# Patient Record
Sex: Male | Born: 1990 | Smoking: Former smoker
Health system: Southern US, Community
[De-identification: ages and names within clinical notes are randomized; demographics above are authoritative.]

## PROBLEM LIST (undated history)

## (undated) NOTE — ED Provider Notes (Signed)
Formatting of this note is different from the original.  eMERGENCY dEPARTMENT eNCOUnter      CHIEF COMPLAINT    Chief Complaint   Patient presents with   ? Headache-ReEvaluation     Pt states headache unrelenting x 5 days seen at [facility] on 7/4 and given toradol w/ no relief, also states fever of 102 this am (afebrile here), taking OTC excedrin with no relief.     HPI    Cory Hill is a 25 y.o. male who presents to the Emergency Department history of headache for 5 days.  He has never been a headache sufferer he states is continuous Excedrin seems to make it better but when he was at the emergency department on July 4th they gave him Toradol which did nothing for it.  States he has also had some recurrent fever during this time up to 102. No cough no sore throat no abdominal pain.  Reports he vomited once yesterday.  Headache is worsened by shaking his head.  No recent nasal congestion.    PAST MEDICAL HISTORY    History reviewed. No pertinent past medical history.    SURGICAL HISTORY    History reviewed. No pertinent surgical history.    CURRENT MEDICATIONS    No current facility-administered medications for this encounter.      Current Outpatient Medications   Medication Sig Dispense Refill   ? butalbital-acetaminophen-caffeine (FIORICET) 50-325-40 mg per tablet Take 1 tablet by mouth every 6 (six) hours as needed for Headaches. 12 tablet 0     ALLERGIES    No Known Allergies    FAMILY HISTORY    History reviewed. No pertinent family history.    SOCIAL HISTORY    Social History     Socioeconomic History   ? Marital status: Married     Spouse name: None   ? Number of children: None   ? Years of education: None   ? Highest education level: None   Occupational History   ? None   Social Needs   ? Financial resource strain: None   ? Food insecurity     Worry: None     Inability: None   ? Transportation needs     Medical: None     Non-medical: None   Tobacco Use   ? Smoking status: Current Some Day Smoker     Types:  Cigarettes   ? Smokeless tobacco: Never Used   ? Tobacco comment: Hookah occasionally   Substance and Sexual Activity   ? Alcohol use: Yes     Comment: 3x/week   ? Drug use: Never   ? Sexual activity: None   Lifestyle   ? Physical activity     Days per week: None     Minutes per session: None   ? Stress: None   Relationships   ? Social Wellsite geologist on phone: None     Gets together: None     Attends religious service: None     Active member of club or organization: None     Attends meetings of clubs or organizations: None     Relationship status: None   ? Intimate partner violence     Fear of current or ex partner: None     Emotionally abused: None     Physically abused: None     Forced sexual activity: None   Other Topics Concern   ? None   Social History Narrative   ? None  REVIEW OF SYSTEMS    Constitutional:  Positive for fever no chills there is some malaise  Eyes:  No visual complaints  HENT:  Denies sore throat, no ear pain.    Respiratory:  Denies cough, no shortness of breath.    Cardiovascular:  Denies chest pain, no palpitations  GI:  Denies abdominal pain, positive for nausea vomiting  GU: Denies any urinary complaints  Musculoskeletal:  Denies pain or swelling  Back:  Denies pain.    Neck: Denies pain  Skin:  Denies rash.    Neurologic:  Positive for generalized headache    See HPI for further details.  A complete review of systems has been obtained and is included in the HPI as above and as per nursing notes. 10 systems have been reviewed and are otherwise negative.    PHYSICAL EXAM      VITAL SIGNS: BP 131/58 (BP Location: Left upper arm, Patient Position: Sitting)   Pulse 74   Temp 98.3 F (36.8 C) (Oral)   Resp 20   Ht 5\' 7"  (1.702 m)   Wt 135 lb (61.2 kg)   SpO2 100%   BMI 21.14 kg/m   Constitutional:  Does not appear ill or uncomfortable  Head : No appearance of trauma, no raccoon or Battle sign no scalp tenderness  HENT:  Grossly normal with patent airway, no rhinorrhea  Eyes:   PERRL, EOMI, conjunctiva normal, no discharge.  Neck: Supple painless ROM, trachea midline; no nuchal rigidity no meningismus  Respiratory:  breath sounds normal, no resp distress  Cardiovascular:  heart sounds nl, regular rate and rhythm.  Back:  Non tender, normal painless ROM          GI:   Soft, non-tender, no distention, normal active bowel sounds  Musculoskeletal:  atraumatic, no pedal edema, nml ROM, nml color/temp  Skin:  intact, warm dry, no rash  Neurologic:  alert and oriented x 3, CN?s nml as tested, sensation nml, motor nml,    RADIOLOGY    Reviewed by me. See official radiology interpretation.  Cat Scan Head Wo Contrast    Result Date: 10/27/2018  CT OF THE BRAIN WITHOUT CONTRAST: Multiple axial images were obtained from the base of the skull to the vertex without intravenous contrast. CT technique utilized automated dose reduction. COMPARISON: None. FINDINGS: No mass, hemorrhage, edema, or infarction. No hydrocephalus. No abnormal intracranial calcification. No bony or extracranial soft tissue/sinus abnormality.     CONCLUSION: NORMAL CT OF THE BRAIN.  Electronically Signed By: Josefina Do, MD 10/27/2018 11:29    Labs Reviewed   CBC WITH DIFFERENTIAL - Abnormal; Notable for the following components:       Result Value    RBC Count 4.40 (*)     Hemoglobin 13.3 (*)     Hematocrit 38.8 (*)     RDW 11.2 (*)     MPV 9.3 (*)     All other components within normal limits   METABOLIC PANEL, COMPREHENSIVE - Abnormal; Notable for the following components:    Total Bilirubin 3.7 (*)     Alk Phos 40 (*)     All other components within normal limits   URINALYSIS, REFLEX WITH MICROSCOPIC     ED COURSE & MEDICAL DECISION MAKING    Pertinent labs & imaging studies reviewed. (See chart for details)  No remarkable findings on labs nor on imaging in certainly noted diagnostic findings.  I discussed with him they could have a viral meningitis this  would possibly not make him have elevated white count this might also  come with minimal symptoms are than headache and fever after 6 days I have less concern for bacterial meningitis because that would have a much more severe course.  The patient is not open to having a lumbar puncture nor do I feel that he absolutely needs it is at this point I feel that management of his symptoms is appropriate and close follow-up less he worsens in some way.  Patient states he ?feels nothing" at this point and feels much better.    FINAL IMPRESSION    Final diagnoses:   Fever, unspecified fever cause   Acute nonintractable headache, unspecified headache type     Portions of this note may be dictated using Dragon Naturally Speaking voice recognition software. Variances in spelling and vocabulary are possible and unintentional. Not all errors are caught/corrected. Please notify the Thereasa Parkin if any discrepancies are noted or if the meaning of any statement is not clear.       Todd C. Ilsa Iha, MD  10/27/18 1218    Electronically signed by Queen Slough. Ilsa Iha, MD at 10/27/2018 12:18 PM EDT

---
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
## 2012-04-12 IMAGING — CR DG FINGER MIDDLE 2+V*R*
3 series · 3 of 3 positions shown · non-contrast
Comparison: None.

CLINICAL DATA: Smashed tip of right third finger under sewer
drainage pipe lid; swelling and bruising at the distal third
finger.

RIGHT MIDDLE FINGER 2+V

[x finger pa right]
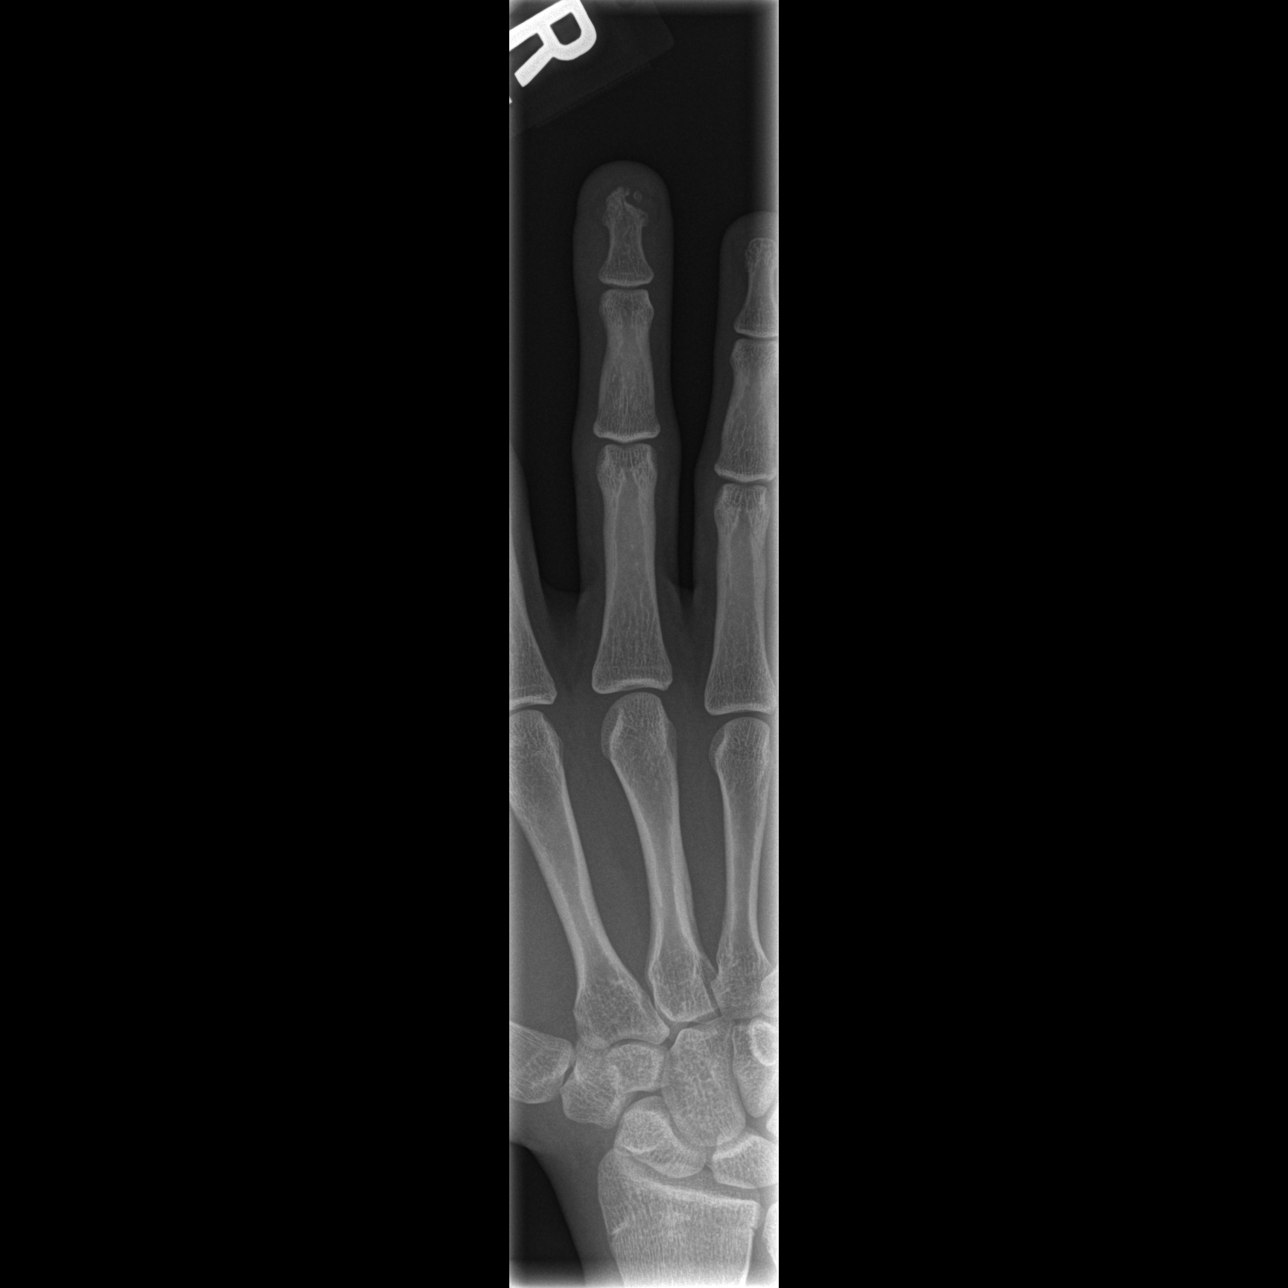

[x finger obl. right]
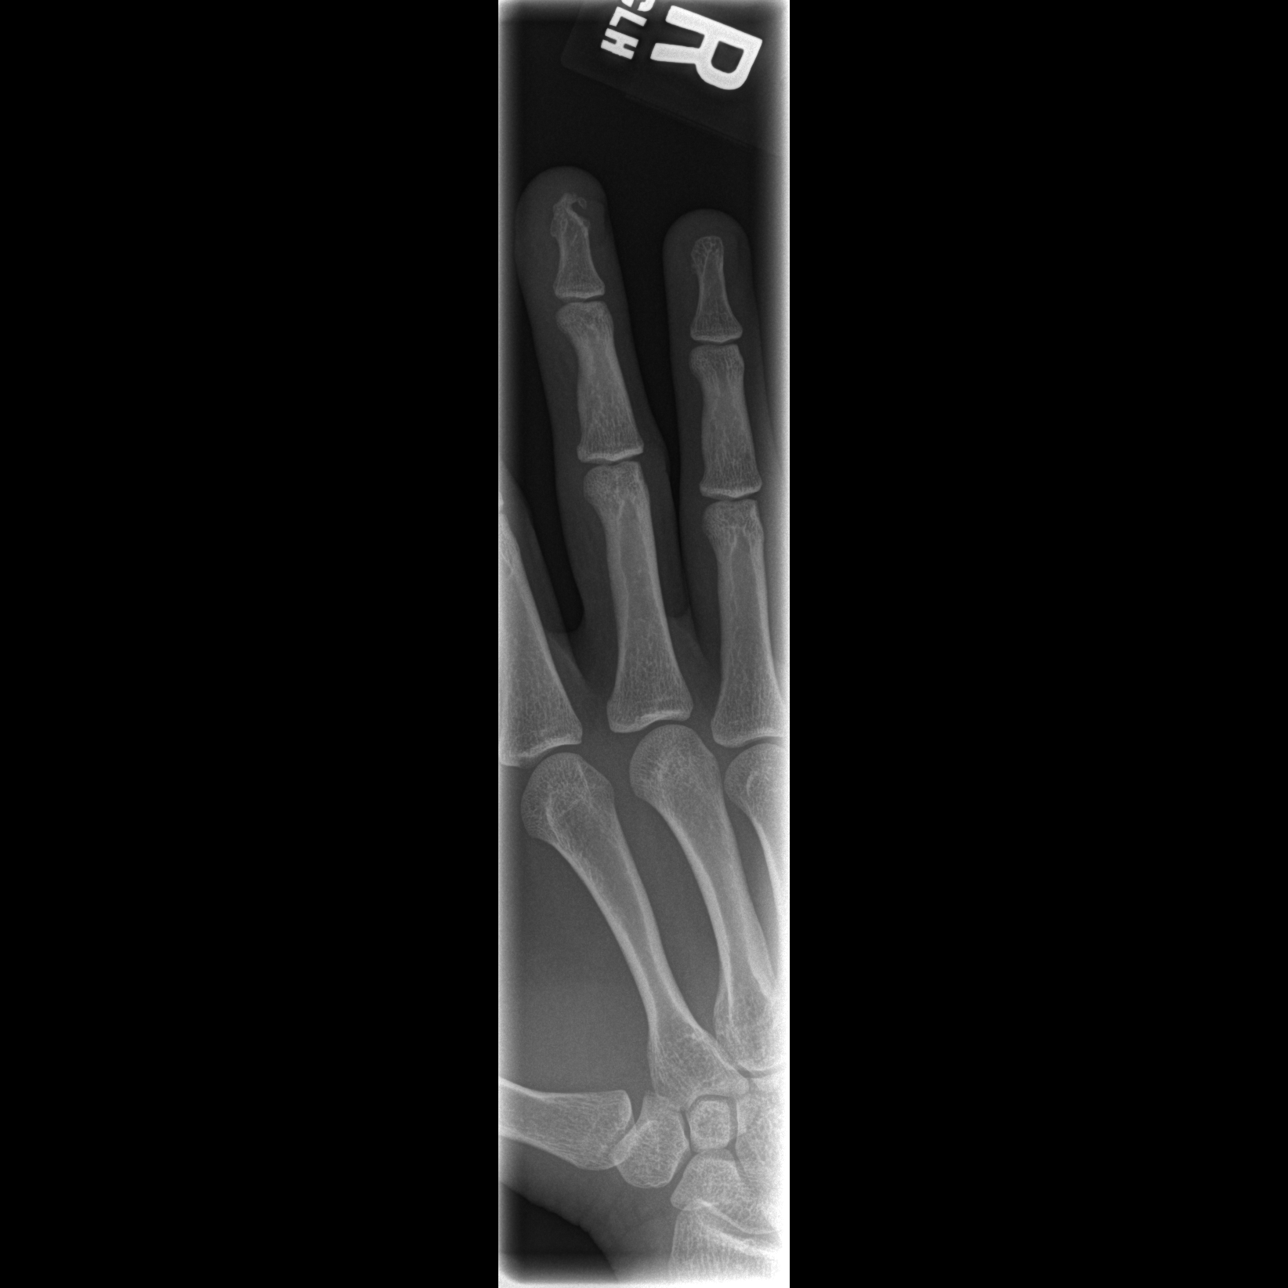

[x finger lateral right]
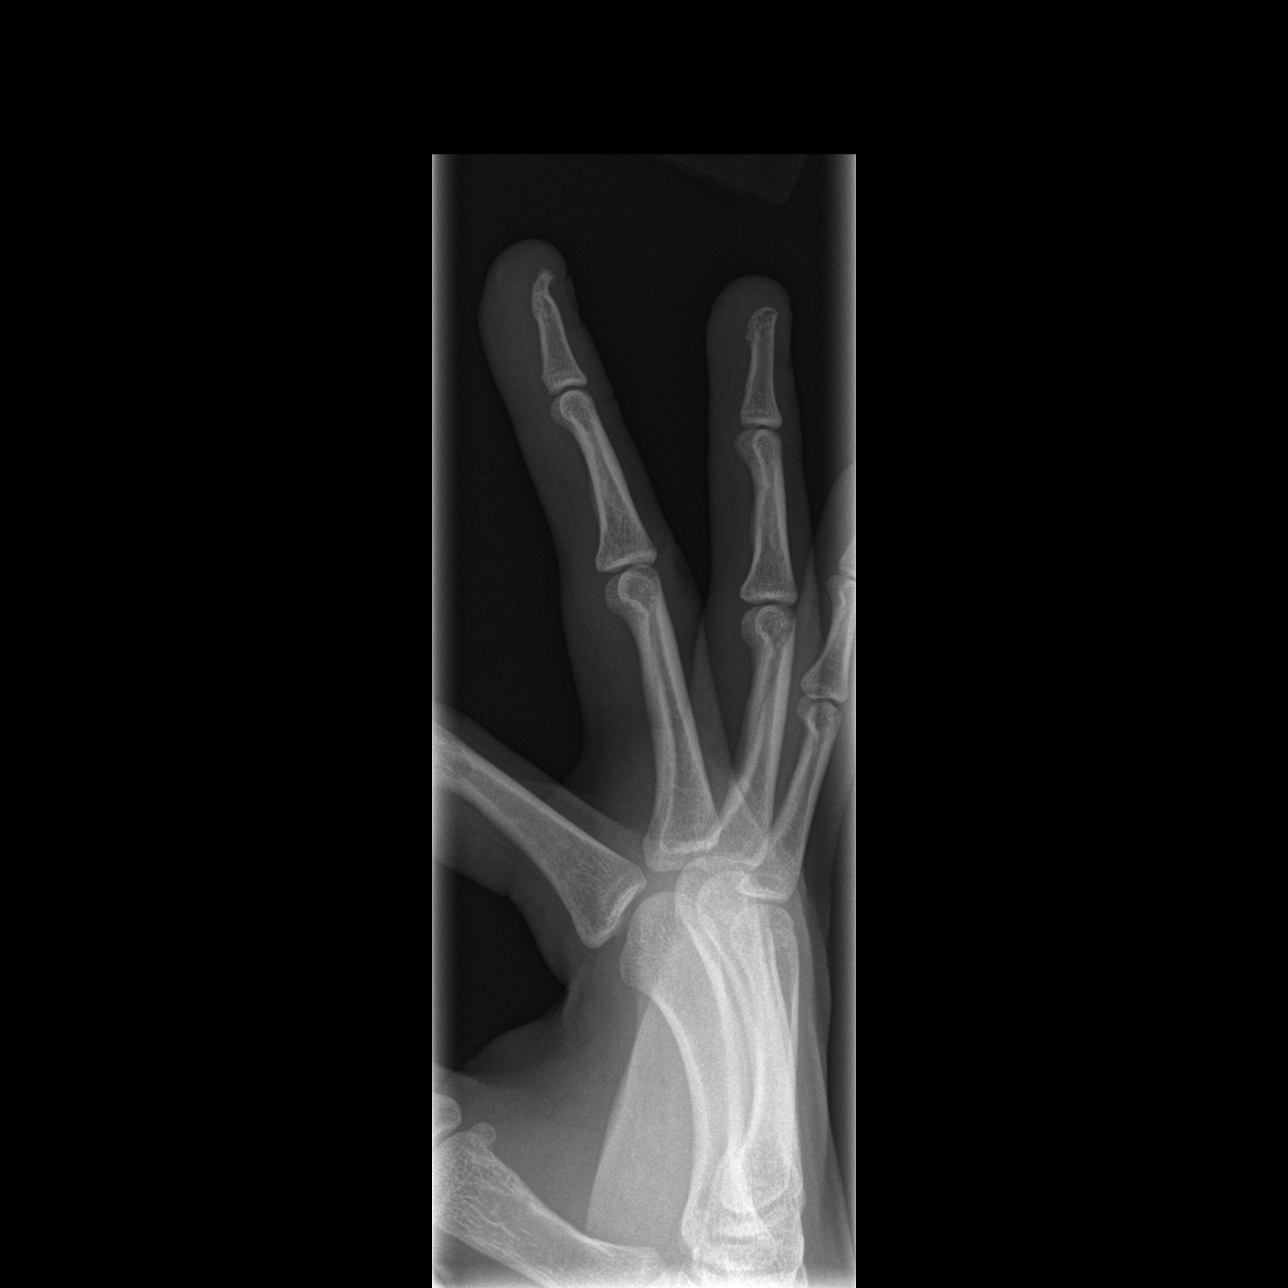

[3 of 3 positions shown; findings below may reference images not displayed]

FINDINGS: Mild focal irregularity at the ulnar aspect of the distal
tuft of the third digit appears to reflect a mild impaction injury
involving the distal tuft, with a small displaced osseous fragment.
There is slight dorsal tilt on the lateral view, due to the mild
impaction.

Visualized joint spaces are preserved.  Mild soft tissue swelling
is noted at the distal aspect of the third digit.
IMPRESSION: Mild focal irregularity at the ulnar aspect of the distal tuft of
the third digit appears to reflect a mild impaction fracture
involving the distal tuft, with a small displaced osseous fragment.

## 2013-03-18 ENCOUNTER — Ambulatory Visit: Payer: Self-pay | Admitting: Family Medicine

## 2013-03-18 VITALS — BP 110/64 | HR 72 | Temp 99.5°F | Resp 18 | Ht 68.5 in | Wt 130.2 lb

## 2013-03-18 DIAGNOSIS — R51 Headache: Secondary | ICD-10-CM

## 2013-03-18 LAB — POCT CBC
Granulocyte percent: 63.1 %G (ref 37–80)
HCT, POC: 42.5 % — AB (ref 43.5–53.7)
Hemoglobin: 13.4 g/dL — AB (ref 14.1–18.1)
Lymph, poc: 2.6 (ref 0.6–3.4)
MCH, POC: 29.2 pg (ref 27–31.2)
MCHC: 31.5 g/dL — AB (ref 31.8–35.4)
MCV: 92.6 fL (ref 80–97)
MID (cbc): 0.6 (ref 0–0.9)
MPV: 7.9 fL (ref 0–99.8)
POC Granulocyte: 5.6 (ref 2–6.9)
POC LYMPH PERCENT: 29.6 %L (ref 10–50)
POC MID %: 7.3 %M (ref 0–12)
Platelet Count, POC: 236 10*3/uL (ref 142–424)
RBC: 4.59 M/uL — AB (ref 4.69–6.13)
RDW, POC: 13.1 %
WBC: 8.8 10*3/uL (ref 4.6–10.2)

## 2013-03-18 MED ORDER — HYDROCODONE-ACETAMINOPHEN 5-325 MG PO TABS: 1.0000 | ORAL_TABLET | Freq: Four times a day (QID) | ORAL | Status: AC | PRN

## 2013-03-18 NOTE — Progress Notes (Signed)
22 yo unemployed individual with several days of global headache associated with vomiting.  Vomiting occurs in am after sleeping all night  Sig Negs: no visual changes, no trauma, possible low grade temp  Associated with body aches.  Family history:  No headaches  Objective:  NAD, alert HEENT: unremarkable Chest:  Clear Heart: reg, no murmur Skin: no rash  Results for orders placed in visit on 03/18/13  POCT CBC      Result Value Range   WBC 8.8  4.6 - 10.2 K/uL   Lymph, poc 2.6  0.6 - 3.4   POC LYMPH PERCENT 29.6  10 - 50 %L   MID (cbc) 0.6  0 - 0.9   POC MID % 7.3  0 - 12 %M   POC Granulocyte 5.6  2 - 6.9   Granulocyte percent 63.1  37 - 80 %G   RBC 4.59 (*) 4.69 - 6.13 M/uL   Hemoglobin 13.4 (*) 14.1 - 18.1 g/dL   HCT, POC 57.8 (*) 46.9 - 53.7 %   MCV 92.6  80 - 97 fL   MCH, POC 29.2  27 - 31.2 pg   MCHC 31.5 (*) 31.8 - 35.4 g/dL   RDW, POC 62.9     Platelet Count, POC 236  142 - 424 K/uL   MPV 7.9  0 - 99.8 fL   Assessment:  Brief onset of headache associated with vomiting and low grade fever suggestive of viral headache.  Plan:  norco 5-325 tid x 48 hours.  If worsening, call and we will proceed with CT scan, no contrast, of head   Signed, Elvina Sidle, MD

## 2013-03-18 NOTE — Patient Instructions (Signed)
Migraine Headache A migraine headache is an intense, throbbing pain on one or both sides of your head. A migraine can last for 30 minutes to several hours. CAUSES  The exact cause of a migraine headache is not always known. However, a migraine may be caused when nerves in the brain become irritated and release chemicals that cause inflammation. This causes pain. SYMPTOMS  Pain on one or both sides of your head.  Pulsating or throbbing pain.  Severe pain that prevents daily activities.  Pain that is aggravated by any physical activity.  Nausea, vomiting, or both.  Dizziness.  Pain with exposure to bright lights, loud noises, or activity.  General sensitivity to bright lights, loud noises, or smells. Before you get a migraine, you may get warning signs that a migraine is coming (aura). An aura may include:  Seeing flashing lights.  Seeing bright spots, halos, or zig-zag lines.  Having tunnel vision or blurred vision.  Having feelings of numbness or tingling.  Having trouble talking.  Having muscle weakness. MIGRAINE TRIGGERS  Alcohol.  Smoking.  Stress.  Menstruation.  Aged cheeses.  Foods or drinks that contain nitrates, glutamate, aspartame, or tyramine.  Lack of sleep.  Chocolate.  Caffeine.  Hunger.  Physical exertion.  Fatigue.  Medicines used to treat chest pain (nitroglycerine), birth control pills, estrogen, and some blood pressure medicines. DIAGNOSIS  A migraine headache is often diagnosed based on:  Symptoms.  Physical examination.  A CT scan or MRI of your head. TREATMENT Medicines may be given for pain and nausea. Medicines can also be given to help prevent recurrent migraines.  HOME CARE INSTRUCTIONS  Only take over-the-counter or prescription medicines for pain or discomfort as directed by your caregiver. The use of long-term narcotics is not recommended.  Lie down in a dark, quiet room when you have a migraine.  Keep a journal  to find out what may trigger your migraine headaches. For example, write down:  What you eat and drink.  How much sleep you get.  Any change to your diet or medicines.  Limit alcohol consumption.  Quit smoking if you smoke.  Get 7 to 9 hours of sleep, or as recommended by your caregiver.  Limit stress.  Keep lights dim if bright lights bother you and make your migraines worse. SEEK IMMEDIATE MEDICAL CARE IF:   Your migraine becomes severe.  You have a fever.  You have a stiff neck.  You have vision loss.  You have muscular weakness or loss of muscle control.  You start losing your balance or have trouble walking.  You feel faint or pass out.  You have severe symptoms that are different from your first symptoms. MAKE SURE YOU:   Understand these instructions.  Will watch your condition.  Will get help right away if you are not doing well or get worse. Document Released: 04/09/2005 Document Revised: 07/02/2011 Document Reviewed: 03/30/2011 [employer] Patient Information 2014 [employer].  

## 2013-12-11 ENCOUNTER — Ambulatory Visit (INDEPENDENT_AMBULATORY_CARE_PROVIDER_SITE_OTHER): Payer: Self-pay | Admitting: Family Medicine

## 2013-12-11 VITALS — BP 119/72 | HR 70 | Temp 98.0°F | Resp 18 | Wt 127.0 lb

## 2013-12-11 DIAGNOSIS — R894 Abnormal immunological findings in specimens from other organs, systems and tissues: Secondary | ICD-10-CM

## 2013-12-11 MED ORDER — ACYCLOVIR 400 MG PO TABS: 400.0000 mg | ORAL_TABLET | Freq: Two times a day (BID) | ORAL | Status: AC

## 2013-12-11 NOTE — Patient Instructions (Signed)
As we discussed a positive blood test may not be definitely a herpes outbreak.  You can take the acyclovir twice per day for prevention of outbreak, but condom use everytime.  If new rash - I recommend you return to clinic right away so we can do an actual  Swab of area for more definitive testing.  If you do have a new rash, or outbreak - the dose of acyclovir is 3 times per day for 5 days.  Return to the clinic or go to the nearest emergency room if any of your symptoms worsen or new symptoms occur.  To look up more info on your condition, go to the website [email or web address], then on patient resources - select UPTODATE. Under patient resources, select genital herpes

## 2013-12-11 NOTE — Progress Notes (Signed)
Subjective:    Patient ID: Daniel Little Alkhatib, male    DOB: 1990/06/21, 21 y.o.   MRN: [id number] This chart was scribed for Meredith StaggersJeffrey Jisele Price MD by Julian HyMorgan Graham, ED Scribe. The patient was seen in Room 4. The patient's care was started at 2:17 PM.   12/11/2013  HPI Daniel Little Saylor is a 23 y.o. 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  Pt reports he has had 4 lifetime partners. Pt reports he still has a blister on his penis. Pt denies penile pain, headache, fatigue, fever, chills, testicular pain, and no dysuria.   Review of Systems  Constitutional: Negative for fever.  Genitourinary: Positive for discharge. Negative for penile swelling, penile pain and testicular pain.    No past medical history on file. No past surgical history on file. No Known Allergies Current Outpatient Prescriptions  Medication Sig Dispense Refill  . HYDROcodone-acetaminophen (NORCO) 5-325 MG per tablet Take 1 tablet by mouth every 6 (six) hours as needed for moderate pain.  12 tablet  0   No current facility-administered medications for this visit.       Objective:  Physical Exam  Nursing note and vitals reviewed. Constitutional: He is oriented to person, place, and time. He appears well-developed and well-nourished. No distress.  But anxious appearing at times discussing results form outside blood test.   HENT:  Head: Normocephalic and atraumatic.  Pulmonary/Chest: Effort normal.  Genitourinary:  Small, healing, reddened area under the mid aspect of the penial shaft. No vesicles or discharge, no other visible rash. No inguinal LAD. No penile discharge. No testicular tenderness.   Neurological:  He is alert and oriented to person, place, and time.  Skin: Skin is warm and dry.  Psychiatric: His speech is normal and behavior is normal. Thought content normal. His mood appears anxious.     Filed Vitals:   12/11/13 1331  BP: 119/72  Pulse: 70  Temp: 98 F (36.7 C)  TempSrc: Oral  Resp: 18  Weight: 127 lb (57.607 kg)  SpO2: 99%  Body mass index is 19.03 kg/(m^2).   Assessment & Plan:  2:30 PM- Patient informed of current plan for treatment and evaluation and agrees with plan at this time.  Daniel Little Lieske is a 23 y.o. 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  Discussed and answered multiple questions at length about interpreting this test.  Resolving papule/pustule on undersurface of penile shaft could have been HSV, but discussed usual 1st episode as more involvement with multiple painful vesicles usually. Ideally if has another new rash - return early in course to have viral swab of affected area for clarification.   He would like to start daily suppressive therapy in case the recent penile rash was genital HSV. Discussed strict condom use, even with taking suppressive/daily med.   Acyclovir 400mg  BID, follow up for refill in next 6 months - sooner if any new rash for confirmation testing as above.    Meds ordered this encounter  Medications  . acyclovir (ZOVIRAX) 400 MG tablet    Sig: Take 1 tablet (400 mg total) by mouth  2 (two) times daily.    Dispense:  60 tablet    Refill:  5   Patient Instructions  As we discussed a positive blood test may not be definitely a herpes outbreak.  You can take the acyclovir twice per day for prevention of outbreak, but condom use everytime.  If new rash - I recommend you return to clinic right away so we can do an actual  Swab of area for more definitive testing.  If you do have a new rash, or outbreak - the dose of acyclovir is 3 times per day for 5 days.  Return to  the clinic or go to the nearest emergency room if any of your symptoms worsen or new symptoms occur.  To look up more info on your condition, go to the website [email or web address], then on patient resources - select UPTODATE. Under patient resources, select genital herpes    I personally performed the services described in this documentation, which was scribed in my presence. The recorded information has been reviewed and considered, and addended by me as needed.

## 2022-06-26 NOTE — Progress Notes (Signed)
[id number]  [place], [place] [address]  Ph. [phone], Joylene Igo [id number]                Date of Exam: 06/27/2022 9:15 AM        Patient ID: Cory Hill is a 32 y.o. male.  Provider: Roxy Manns, FNP        Chief Complaint:    Chief Complaint   Patient presents with    Annual Exam             HPI:    Pt presents today for a wellness exam. Patient is fasting .  6 months ago moved from [place].    Chronic diseases: None  Refills needed today: None    Depression Screening  PHQ9: 5, more like work stress  Concerns for SI or HI: No    Vaccines:  - TDAP: sometime in the last 5 years, declined it today.  - FLU: No  - COVID: Yes, no booster    Regular Screening  Eye exam: no  Dental exam: regularly, every 6 months  Exercise: 1-2 times every week. Always walking, active  Diet: good  Alcohol: 2 drinks a day  Smoking: former occasional smoker, 1 cigarrete every 2-3 months.  Drug: Never  Sexually active: is sexually active and and is monogomous with current partner, 1 male partner  Concerns for STDs:No            Problem List:    There is no problem list on file for this patient.            Current Meds:    No current outpatient medications on file.        Allergies:    No Known Allergies        Past Medical History:      History reviewed. No pertinent past medical history.        Past Surgical History:    History reviewed. No pertinent surgical history.        Family History:    History reviewed. No pertinent family history.        Social History:    Social History     Tobacco Use    Smoking status: Former     Types: Cigarettes    Smokeless tobacco: Never   Vaping Use    Vaping Use: Former   Substance Use Topics    Alcohol use: Yes     Alcohol/week: 14.0 standard drinks of alcohol     Types: 14 Shots of liquor per week    Drug use: Not Currently           Sections Reviewed:       The following sections were reviewed this encounter by the provider:   Tobacco  Allergies  Meds  Problems   Med Hx  Surg Hx  Fam Hx            Vital Signs:    Visit Vitals  BP 100/60 (BP Site: Right arm, Patient Position: Sitting, Cuff Size: Medium)   Pulse 64   Temp 98 F (36.7 C) (Temporal)   Resp 16   Ht 1.708 m (5' 7.25")   Wt 62.6 kg (138 lb)   BMI 21.45 kg/m             ROS:    Review of Systems   Constitutional:  Negative for fatigue and fever.   HENT:  Negative for ear pain, rhinorrhea and sore throat.  Respiratory:  Negative for cough and shortness of breath.    Cardiovascular:  Negative for chest pain.   Gastrointestinal:  Negative for abdominal pain and vomiting.   Genitourinary:  Negative for difficulty urinating.   Musculoskeletal:  Negative for back pain and neck pain.   Skin:  Negative for rash.   Neurological:  Negative for dizziness and weakness.   Hematological:  Does not bruise/bleed easily.   Psychiatric/Behavioral:  Negative for dysphoric mood.               Physical Exam:    Physical Exam  Vitals and nursing note reviewed.   Constitutional:       Appearance: Normal appearance.   HENT:      Head: Normocephalic and atraumatic.      Right Ear: Tympanic membrane, ear canal and external ear normal.      Left Ear: Tympanic membrane, ear canal and external ear normal.      Nose: Nose normal.      Mouth/Throat:      Mouth: Mucous membranes are moist.   Eyes:      Extraocular Movements: Extraocular movements intact.      Pupils: Pupils are equal, round, and reactive to light.   Cardiovascular:      Rate and Rhythm: Normal rate and regular rhythm.      Heart sounds: Normal heart sounds.   Pulmonary:      Effort: Pulmonary effort is normal.      Breath sounds: Normal breath sounds.   Abdominal:      General: Abdomen is flat. Bowel sounds are normal.      Palpations: Abdomen is soft.   Genitourinary:     Comments: Exam deferred today since pt is not having any pain and has not felt any abnormalities  Discussed self examination  Recommend pt schedule an appointment if symptoms occur    Musculoskeletal:          General: Normal range of motion.      Cervical back: Neck supple.   Skin:     General: Skin is warm.      Capillary Refill: Capillary refill takes less than 2 seconds.   Neurological:      General: No focal deficit present.      Mental Status: He is alert and oriented to person, place, and time. Mental status is at baseline.   Psychiatric:         Mood and Affect: Mood normal.         Thought Content: Thought content normal.         Judgment: Judgment normal.                Assessment:    1. Wellness examination  - CBC and differential  - Comprehensive metabolic panel  - Lipid panel  - Hepatitis C (HCV) antibody, Total    2. Screening for diabetes mellitus  - Comprehensive metabolic panel    3. Lipid screening  - Lipid panel    4. Encounter for hepatitis C screening test for low risk patient  - Hepatitis C (HCV) antibody, Total            Plan:    General Wellness:  Check screening blood work -  informed pt results will be available via MyChart  Encouraged regular aerobic exercise at least 3-4 times per week for 30-45 minutes per session  Encouraged healthy diet with 5-9 servings of fruits and vegetables per day  Encouraged dental  exam every 6-12 months  Encouraged eye exam every 1-2 years  Encouraged adequate daily H2O intake  Education provided regarding sun exposure and skin care habits  Please sign up to My Chart. This system allows you to review your medical records, lab results and it also allows you to e-mail me directly and or make appointments with me on line.  As posted in each exam room now that during a well physical- Any new acute health problems, uncontrolled chronic medical conditions, or office procedures that is done today are not considered part of the physical and that an additional copayment or deductible may apply.  Thank you for understanding    Vaccines:   I recommend getting the flu shot: Our office is out of flu shots  ALSO, if you have not, please get the current Covid-19 booster- can  get it at the same time as flu vaccine    Go to any local pharmacy or grocery store -- you may be able to even schedule online      To reduce the risk of alcohol-related harms, the 2020-2025 Dietary Guidelines for Americans recommends that adults of legal drinking age can choose not to drink, or to drink in moderation by limiting intake to 2 drinks or less in a day for men or 1 drink or less in a day for women, on days when alcohol is consumed.     In the [place], one "standard" drink (or one alcoholic drink equivalent) contains roughly 14 grams of pure alcohol, which is found in:  12 ounces of regular beer, which is usually about 5% alcohol  5 ounces of wine, which is typically about 12% alcohol  1.5 ounces of distilled spirits, which is about 40% alcohol    All questions answered. Patient agreeable and amenable to plan. Patient verbalizes understanding.          Follow-up:    Return in about 1 year (around 06/27/2023) for annual physical exam or sooner if needed.         Roxy Manns, FNP   *DISCLAIMER: This note was generated by the Epic EMR system/ Dragon speech recognition and may contain inherent errors or omissions not intended by the user. Grammatical errors, random word insertions, deletions, pronoun errors and incomplete sentences are occasional consequences of this technology due to software limitations. Not all errors are caught or corrected. If there are questions or concerns about the content of this note or information contained within the body of this dictation they should be addressed directly with the author for clarification.*

## 2022-06-26 NOTE — Patient Instructions (Signed)
Healthy Lifestyle    A healthy lifestyle can help you feel good, stay at a healthy weight, and have plenty of energy for both work and play. A healthy lifestyle is something you can share with your whole family.    A healthy lifestyle also can lower your risk for serious health problems, such as high blood pressure, heart disease, and diabetes.    You can follow a few steps listed below to improve your health and the health of your family.    Follow-up care is a key part of your treatment and safety. Be sure to make and go to all appointments, and call your doctor if you are having problems. It's also a good idea to know your test results and keep a list of the medicines you take.    How can you care for yourself at home?  Do not eat too much sugar, fat, or fast foods. You can still have dessert and treats now and then. The goal is moderation.  Start small to improve your eating habits. Pay attention to portion sizes, drink less juice and soda pop, and eat more fruits and vegetables.  Eat a healthy amount of food. A 3-ounce serving of meat, for example, is about the size of a deck of cards. Fill the rest of your plate with vegetables and whole grains.  Limit the amount of soda and sports drinks you have every day. Drink more water when you are thirsty.  Eat at least 5 servings of fruits and vegetables every day. It may seem like a lot, but it is not hard to reach this goal. A serving or helping is 1 piece of fruit, 1 cup of vegetables, or 2 cups of leafy, raw vegetables. Have an apple or some carrot sticks as an afternoon snack instead of a candy bar. Try to have fruits and/or vegetables at every meal.  Make exercise part of your daily routine. You may want to start with simple activities, such as walking, bicycling, or slow swimming. Try to be active 30 to 60 minutes every day. You do not need to do all 30 to 60 minutes all at once. For example, you can exercise 3 times a day for 10 or 20 minutes. Moderate exercise  is safe for most people, but it is always a good idea to talk to your doctor before starting an exercise program.  Keep moving. Mow the lawn, work in the garden, or clean your house. Take the stairs instead of the elevator at work.  If you smoke, quit. People who smoke have an increased risk for heart attack, stroke, cancer, and other lung illnesses. Quitting is hard, but there are ways to boost your chance of quitting tobacco for good.  Use nicotine gum, patches, or lozenges.  Ask your doctor about stop-smoking programs and medicines.  Keep trying.  In addition to reducing your risk of diseases in the future, you will notice some benefits soon after you stop using tobacco. If you have shortness of breath or asthma symptoms, they will likely get better within a few weeks after you quit.  Limit how much alcohol you drink. Moderate amounts of alcohol (up to 2 drinks a day for men, 1 drink a day for women) are okay. But drinking too much can lead to liver problems, high blood pressure, and other health problems.        Family health  If you have a family, there are many things you can do together to improve   your health.    Eat meals together as a family as often as possible.  Eat healthy foods. This includes fruits, vegetables, lean meats and dairy, and whole grains.  Include your family in your fitness plan. Most people think of activities such as jogging or tennis as the way to fitness, but there are many ways you and your family can be more active. Anything that makes you breathe hard and gets your heart pumping is exercise. Here are some tips:  Walk to do errands or to take your child to school or the bus.  Go for a family bike ride after dinner instead of watching TV.     [employer] Healthy Eating Plate        Copyright  2011, [employer]. For more information about The Healthy Eating Plate, please see The Nutrition Source, Department of Nutrition, [employer],  www.thenutritionsource.org, and [employer] Publications, www.health.harvard.edu.

## 2022-06-27 ENCOUNTER — Ambulatory Visit (INDEPENDENT_AMBULATORY_CARE_PROVIDER_SITE_OTHER): Payer: Self-pay

## 2022-06-27 ENCOUNTER — Encounter (INDEPENDENT_AMBULATORY_CARE_PROVIDER_SITE_OTHER): Payer: Self-pay

## 2022-06-27 VITALS — BP 100/60 | HR 64 | Temp 98.0°F | Resp 16 | Ht 67.25 in | Wt 138.0 lb

## 2022-06-27 DIAGNOSIS — Z0000 Encounter for general adult medical examination without abnormal findings: Secondary | ICD-10-CM

## 2022-06-27 DIAGNOSIS — Z13220 Encounter for screening for lipoid disorders: Secondary | ICD-10-CM

## 2022-06-27 DIAGNOSIS — Z1159 Encounter for screening for other viral diseases: Secondary | ICD-10-CM

## 2022-06-27 DIAGNOSIS — Z131 Encounter for screening for diabetes mellitus: Secondary | ICD-10-CM

## 2022-06-27 LAB — COMPREHENSIVE METABOLIC PANEL
ALT: 21 U/L (ref 0–55)
AST (SGOT): 23 U/L (ref 5–41)
Albumin/Globulin Ratio: 1.7 (ref 0.9–2.2)
Albumin: 4.4 g/dL (ref 3.5–5.0)
Alkaline Phosphatase: 41 U/L (ref 37–117)
Anion Gap: 7 (ref 5.0–15.0)
BUN: 12 mg/dL (ref 9.0–28.0)
Bilirubin, Total: 2 mg/dL — ABNORMAL HIGH (ref 0.2–1.2)
CO2: 26 mEq/L (ref 17–29)
Calcium: 10 mg/dL (ref 8.5–10.5)
Chloride: 104 mEq/L (ref 99–111)
Creatinine: 0.9 mg/dL (ref 0.5–1.5)
Globulin: 2.6 g/dL (ref 2.0–3.6)
Glucose: 86 mg/dL (ref 70–100)
Potassium: 4.3 mEq/L (ref 3.5–5.3)
Protein, Total: 7 g/dL (ref 6.0–8.3)
Sodium: 137 mEq/L (ref 135–145)
eGFR: >60 mL/min/{1.73_m2} (ref >=60)

## 2022-06-27 LAB — LIPID PANEL
Cholesterol / HDL Ratio: 2.3 Index
Cholesterol: 173 mg/dL (ref 0–199)
HDL: 75 mg/dL (ref 40–9999)
LDL Calculated: 86 mg/dL (ref 0–99)
Triglycerides: 62 mg/dL (ref 34–149)
VLDL Calculated: 12 mg/dL (ref 10–40)

## 2022-06-27 LAB — CBC AND DIFFERENTIAL
Absolute NRBC: 0 10*3/uL (ref 0.00–0.00)
Basophils Absolute Automated: 0.03 10*3/uL (ref 0.00–0.08)
Basophils Automated: 0.5 %
Eosinophils Absolute Automated: 0.11 10*3/uL (ref 0.00–0.44)
Eosinophils Automated: 1.9 %
Hematocrit: 43.4 % (ref 37.6–49.6)
Hgb: 15 g/dL (ref 12.5–17.1)
Immature Granulocytes Absolute: 0.01 10*3/uL (ref 0.00–0.07)
Immature Granulocytes: 0.2 %
Instrument Absolute Neutrophil Count: 3.54 10*3/uL (ref 1.10–6.33)
Lymphocytes Absolute Automated: 1.54 10*3/uL (ref 0.42–3.22)
Lymphocytes Automated: 26.3 %
MCH: 29.9 pg (ref 25.1–33.5)
MCHC: 34.6 g/dL (ref 31.5–35.8)
MCV: 86.5 fL (ref 78.0–96.0)
MPV: 9.8 fL (ref 8.9–12.5)
Monocytes Absolute Automated: 0.63 10*3/uL (ref 0.21–0.85)
Monocytes: 10.8 %
Neutrophils Absolute: 3.54 10*3/uL (ref 1.10–6.33)
Neutrophils: 60.3 %
Nucleated RBC: 0 /100 WBC (ref 0.0–0.0)
Platelets: 287 10*3/uL (ref 142–346)
RBC: 5.02 10*6/uL (ref 4.20–5.90)
RDW: 12 % (ref 11–15)
WBC: 5.86 10*3/uL (ref 3.10–9.50)

## 2022-06-27 LAB — HEPATITIS C ANTIBODY, TOTAL: Hepatitis C, AB: NONREACTIVE

## 2022-06-27 LAB — HEMOLYSIS INDEX(SOFT): Hemolysis Index: 6 Index (ref 0–24)

## 2022-06-28 ENCOUNTER — Other Ambulatory Visit (INDEPENDENT_AMBULATORY_CARE_PROVIDER_SITE_OTHER): Payer: Self-pay

## 2022-06-28 DIAGNOSIS — R17 Unspecified jaundice: Secondary | ICD-10-CM

## 2023-01-28 ENCOUNTER — Encounter (INDEPENDENT_AMBULATORY_CARE_PROVIDER_SITE_OTHER): Payer: Self-pay

## 2023-01-28 ENCOUNTER — Ambulatory Visit (INDEPENDENT_AMBULATORY_CARE_PROVIDER_SITE_OTHER): Payer: Self-pay

## 2023-01-28 ENCOUNTER — Encounter (INDEPENDENT_AMBULATORY_CARE_PROVIDER_SITE_OTHER): Payer: Self-pay | Admitting: Physician Assistant

## 2023-01-28 ENCOUNTER — Ambulatory Visit (INDEPENDENT_AMBULATORY_CARE_PROVIDER_SITE_OTHER): Payer: Self-pay | Admitting: Physician Assistant

## 2023-01-28 ENCOUNTER — Telehealth (INDEPENDENT_AMBULATORY_CARE_PROVIDER_SITE_OTHER): Payer: Self-pay

## 2023-01-28 VITALS — BP 110/68 | HR 72 | Temp 97.2°F | Resp 20 | Ht 67.25 in | Wt 141.6 lb

## 2023-01-28 DIAGNOSIS — R071 Chest pain on breathing: Secondary | ICD-10-CM

## 2023-01-28 NOTE — Progress Notes (Signed)
[facility]  URGENT  CARE  PROGRESS NOTE     Patient: Cory Hill   Date: 01/28/2023   MRN: [id number]       Cory Hill is a 32 y.o. male      HISTORY     History obtained from: Patient    Chief Complaint   Patient presents with    Chest Pain     Pt reports when he woke up this AM 0900 his R arm was numb. When he was getting ready for work 1100, pt describes episode of dizziness, lightheadedness, chest pain, and sudden SOB. Reports L facial numbness.         HPI patient presents with concern regarding chest pain review breathing in.  He also notes upon waking up this morning his right arm was numb as well as the left side of his face.  The symptoms have largely resolved, however the chest pain with deep breathing leading to shortness of breath has continued.  Notes he felt he was going to faint.  Denies any cardiac history, denies any for serial Tums with history of heart attack, stroke for 65/55, denies any drug use, endorses 2-3 beers per night, and hookah every 10 days.  Patient does endorse recent travel from [place].  Patient denies any history of high blood pressure, high cholesterol, or diabetes.    Review of Systems is negative unless noted HPI    History:    Pertinent Past Medical, Surgical, Family and Social History were reviewed.      Current Medications[1]    Allergies[2]    Medications and Allergies reviewed.    PHYSICAL EXAM     Vitals:    01/28/23 1542   BP: 110/68   Pulse: 72   Resp: 20   Temp: 97.2 F (36.2 C)   TempSrc: Tympanic   SpO2: 98%   Weight: 64.2 kg (141 lb 9.6 oz)   Height: 1.708 m (5' 7.25")       Physical Exam Vitals and nursing note reviewed.   Constitutional:       General: Not in acute distress.     Appearance: Normal appearance. Not ill-appearing or toxic-appearing.   HENT:      Head: Normocephalic and atraumatic.   Eyes:      Conjunctiva/sclera: Conjunctivae normal.   Neck:      Musculoskeletal: Normal range of motion.   Respiratory:      Normal effort. Able to speak in full  sentences.  Neurological:      Mental Status: Alert and oriented.  Psychiatric:         Mood and Affect: Mood normal.         Behavior: Behavior normal.        UCC COURSE     There were no labs reviewed with this patient during the visit.    There were no x-rays reviewed with this patient during the visit.    Current Inpatient Medications with Last Dose Taken[3]    PROCEDURES     Procedures    MEDICAL DECISION MAKING     History, physical, labs/studies most consistent with chest pain on inspiration as the diagnosis.        Chart Review:  Prior PCP, Specialist and/or ED notes reviewed today: No  Prior labs/images/studies reviewed today: No    Differential Diagnosis: Due to patient's chest pain, EKG was done which was normal, patient was advised to follow-up with the emergency room due to chest pain with inspiration concern  for PE.      ASSESSMENT     Encounter Diagnosis   Name Primary?    Chest pain made worse by breathing Yes                PLAN      PLAN: See MDM            Orders Placed This Encounter   Procedures    Task for ECG    Referral to Cardiology ()     Requested Prescriptions      No prescriptions requested or ordered in this encounter       Discussed results and diagnosis with patient/family.  Reviewed warning signs for worsening condition, as well as, indications for follow-up with primary care physician and return to urgent care clinic.   Patient/family expressed understanding of instructions.     An After Visit Summary was provided to the patient.           [1]   Current Outpatient Medications:     Biotin 1 MG Cap, Take by mouth, Disp: , Rfl:     MILK THISTLE PO, Take by mouth, Disp: , Rfl:   [2] No Known Allergies  [3]   No current facility-administered medications for this visit.

## 2023-01-28 NOTE — Telephone Encounter (Signed)
RN received triage call overhead for patient having SOB and tingling in arms.    Per patient, "It started a few hours ago my body was feeling numb and I felt like I was about to pass out. I have some heavy breathing and a little shortness of breath."    RN asked if patient had any dizziness, pain in neck, shoulders, jaw or back, heart palpitations, or chest pain.    Per patient, "no none of those but not chest pain maybe a tightness and half my body is numb like my face and my hands too."    RN placed patient on hold to discuss with NP Sun.    Per NP Wynelle Link, "have the patient come in today"    RN scheduled patient at 245 to see NP Sun.    Patient verbalized he will be here for his appointment and is able to drive himself.

## 2023-01-28 NOTE — Patient Instructions (Addendum)
To rule out pulmonary embolism, the emergency room is the best location to do this.    Please go to  [facility]   [address] [place], [place] [address]

## 2023-01-29 ENCOUNTER — Ambulatory Visit (INDEPENDENT_AMBULATORY_CARE_PROVIDER_SITE_OTHER): Payer: Self-pay

## 2023-02-26 ENCOUNTER — Telehealth (INDEPENDENT_AMBULATORY_CARE_PROVIDER_SITE_OTHER): Payer: Self-pay

## 2023-02-26 NOTE — Telephone Encounter (Signed)
Contacted patient to schedule a Cardiology consult as a new patient based on referral order. Patient requested call next week.

## 2023-03-26 ENCOUNTER — Telehealth (INDEPENDENT_AMBULATORY_CARE_PROVIDER_SITE_OTHER): Payer: Self-pay

## 2023-03-26 NOTE — Telephone Encounter (Signed)
 Contacted patient to schedule a Cardiology consult as a new patient based on referral order. No answer, left a voice message.

## 2023-09-25 ENCOUNTER — Encounter (INDEPENDENT_AMBULATORY_CARE_PROVIDER_SITE_OTHER): Payer: Self-pay | Admitting: Registered Nurse

## 2023-09-25 ENCOUNTER — Ambulatory Visit (FREE_STANDING_LABORATORY_FACILITY): Payer: Self-pay | Admitting: Registered Nurse

## 2023-09-25 VITALS — BP 120/80 | HR 72 | Temp 98.2°F | Resp 16 | Ht 67.0 in | Wt 141.0 lb

## 2023-09-25 DIAGNOSIS — Z0000 Encounter for general adult medical examination without abnormal findings: Secondary | ICD-10-CM

## 2023-09-25 DIAGNOSIS — R0789 Other chest pain: Secondary | ICD-10-CM

## 2023-09-25 DIAGNOSIS — Z13220 Encounter for screening for lipoid disorders: Secondary | ICD-10-CM

## 2023-09-25 DIAGNOSIS — F419 Anxiety disorder, unspecified: Secondary | ICD-10-CM

## 2023-09-25 LAB — LIPID PANEL
Cholesterol / HDL Ratio: 2.2 {index}
Cholesterol: 166 mg/dL (ref <=199)
HDL: 76 mg/dL
LDL Calculated: 81 mg/dL (ref 0–99)
Triglycerides: 44 mg/dL
VLDL Calculated: 9 mg/dL — ABNORMAL LOW (ref 10–40)

## 2023-09-25 LAB — COMPREHENSIVE METABOLIC PANEL
ALT: 41 U/L (ref <=55)
AST (SGOT): 37 U/L (ref <=41)
Albumin/Globulin Ratio: 1.4 (ref 0.9–2.2)
Albumin: 4.2 g/dL (ref 3.5–5.0)
Alkaline Phosphatase: 42 U/L (ref 37–117)
Anion Gap: 8 (ref 5.0–15.0)
BUN: 16 mg/dL (ref 9–28)
Bilirubin, Total: 1.8 mg/dL — ABNORMAL HIGH (ref 0.2–1.2)
CO2: 24 meq/L (ref 17–29)
Calcium: 9.4 mg/dL (ref 8.5–10.5)
Chloride: 104 meq/L (ref 99–111)
Creatinine: 0.8 mg/dL (ref 0.5–1.5)
GFR: >60 mL/min/1.73 m2 (ref >=60.0)
Globulin: 3.1 g/dL (ref 2.0–3.6)
Glucose: 84 mg/dL (ref 70–100)
Hemolysis Index: 23 {index}
Potassium: 4.7 meq/L (ref 3.5–5.3)
Protein, Total: 7.3 g/dL (ref 6.0–8.3)
Sodium: 136 meq/L (ref 135–145)

## 2023-09-25 LAB — ECG 12-LEAD
Atrial Rate: 50 {beats}/min
P Axis: -1 degrees
P-R Interval: 148 ms
Q-T Interval: 404 ms
QRS Duration: 84 ms
QTC Calculation (Bezet): 368 ms
R Axis: -7 degrees
T Axis: 2 degrees
Ventricular Rate: 50 {beats}/min

## 2023-09-25 LAB — LAB USE ONLY - CBC WITH DIFFERENTIAL
Absolute Basophils: 0.02 x10 3/uL (ref 0.00–0.08)
Absolute Eosinophils: 0.05 x10 3/uL (ref 0.00–0.44)
Absolute Immature Granulocytes: 0.02 x10 3/uL (ref 0.00–0.07)
Absolute Lymphocytes: 1.52 x10 3/uL (ref 0.42–3.22)
Absolute Monocytes: 0.5 x10 3/uL (ref 0.21–0.85)
Absolute Neutrophils: 3.12 x10 3/uL (ref 1.10–6.33)
Absolute nRBC: 0 x10 3/uL (ref <=0.00)
Basophils %: 0.4 %
Eosinophils %: 1 %
Hematocrit: 42.9 % (ref 37.6–49.6)
Hemoglobin: 14.4 g/dL (ref 12.5–17.1)
Immature Granulocytes %: 0.4 %
Lymphocytes %: 29.1 %
MCH: 29.5 pg (ref 25.1–33.5)
MCHC: 33.6 g/dL (ref 31.5–35.8)
MCV: 87.9 fL (ref 78.0–96.0)
MPV: 10.1 fL (ref 8.9–12.5)
Monocytes %: 9.6 %
Neutrophils %: 59.5 %
Platelet Count: 280 x10 3/uL (ref 142–346)
Preliminary Absolute Neutrophil Count: 3.12 x10 3/uL (ref 1.10–6.33)
RBC: 4.88 x10 6/uL (ref 4.20–5.90)
RDW: 12 % (ref 11–15)
WBC: 5.23 x10 3/uL (ref 3.10–9.50)
nRBC %: 0 /100{WBCs} (ref <=0.0)

## 2023-09-25 NOTE — Patient Instructions (Signed)
 General Health Maintenance  Annual physical completed. No recent tetanus record, last COVID-19 vaccine in 2022, no flu vaccine last year. Regular exercise, moderate alcohol, occasional hookah use, mixed diet.  - Perform blood work: cholesterol, CBC, comprehensive metabolic panel.  - Obtain tetanus vaccination record.  - Advise flu vaccine and COVID booster in fall.  - Encourage alcohol reduction to no more than two drinks per day.  - Promote increased fruits and vegetables intake.  - Advise cessation of hookah use.  - Encourage regular physical activity and healthy sleep habits.    Chest Pain  Intermittent chest tightness likely due to anxiety and stress. Normal EKG with slightly slow rhythm, no cardiac pathology. Symptoms improve with alcohol, suggesting stress-related component.  - Encouraged reduction of caffeine intake.  - Recommended yoga, meditation, and stress-reduction techniques.  - Provided referral to therapist for anxiety management.  - Consider medication for anxiety if symptoms persist.    Follow-up  Discussed follow-up for health maintenance and symptom management. EKG cost discussed due to self-pay.  - Review blood work results and communicate via MyChart.  - Schedule next annual physical in June 2026.  - Follow up with therapist if anxiety symptoms persist.

## 2023-09-25 NOTE — Progress Notes (Signed)
 [facility] [place] [facility]           Subjective     History of Present Illness  Cory Hill is a 33 year old male who presents for annual exam and with chest tightness and tingling.    He has been experiencing chest tightness and tingling on the left side for the past two days, accompanied by some trouble breathing.  The sensation is described as a 'weird tendency' to feel stiff, with occasional sharp pain in the left side of the chest.   These symptoms have been present intermittently for about one to two weeks.   There is no radiation of pain to the arm or jaw.   He has not had any imaging of the heart or follow-up with a cardiologist in the past.    He reports work-related stress, and his symptoms improve when relaxed, such as during a recent trip to [place] .  Symptoms also subside after consuming alcohol. He drinks two to three cups of black coffee daily but has recently reduced his intake to one cup, suspecting it might be contributing to his symptoms. He does not take any daily medications except for biotin and milk thistle supplements.    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     He smokes hookah once or twice a week and consumes two to three alcoholic drinks per day.   He is sexually active with one male partner and has no concerns for STIs. He reports occasional insomnia but does not describe significant sleep disturbances.      Review of Systems   Constitutional:  Negative for chills, fatigue, fever and unexpected weight change.   HENT:  Negative for ear pain and hearing loss.    Eyes:  Negative for pain and visual disturbance.   Respiratory:  Positive for chest tightness. Negative for shortness of breath.    Cardiovascular:  Negative for chest pain, palpitations and leg swelling.   Gastrointestinal:  Negative for abdominal pain, blood in stool, constipation, diarrhea, nausea and vomiting.   Endocrine: Negative for cold intolerance and  heat intolerance.   Genitourinary:  Negative for difficulty urinating.   Musculoskeletal:  Negative for arthralgias.   Skin:  Negative for rash.   Neurological:  Negative for tremors, weakness, numbness and headaches.   Hematological:  Does not bruise/bleed easily.   Psychiatric/Behavioral:  Negative for dysphoric mood. The patient is nervous/anxious.       as per HPI      Objective   BP 120/80 (BP Site: Left arm, Patient Position: Sitting, Cuff Size: Medium)   Pulse 72   Temp 98.2 F (36.8 C) (Temporal)   Resp 16   Ht 1.702 m (5' 7)   Wt 64 kg (141 lb)   BMI 22.08 kg/m     Physical Exam    Physical Exam  VITALS: P- 72, BP- 120/80  GENERAL: Alert, cooperative, well developed, no acute distress.  HEENT: Normocephalic, normal oropharynx, moist mucous membranes, ears normal.  CHEST: Clear to auscultation bilaterally, no wheezes, rhonchi, or crackles.  CARDIOVASCULAR: Normal heart rate and rhythm, S1 and S2 normal without murmurs.  ABDOMEN: Soft, non-tender, non-distended, without organomegaly, normal bowel sounds.  EXTREMITIES: No cyanosis or edema.  NEUROLOGICAL: Cranial nerves grossly intact, moves all extremities without gross motor or sensory deficit.       Results  Procedure: Electrocardiogram (EKG)  Description: The EKG showed a slightly slow rhythm, which is  normal for patients at this age who exercise regularly. The heart rate was 50, and the overall heart function appeared normal.    DIAGNOSTIC  PHQ-2: 0 (09/25/2023)          Assessment/Plan     Assessment & Plan  Well adult exam    Orders:    CBC with Differential (Order); Future    Comprehensive Metabolic Panel; Future    Screening for lipid disorders    Orders:    Lipid Panel; Future    Chest tightness    Orders:    ECG 12 lead    Anxiety    Orders:    Referral to Clinical Psychologist; Future         Assessment & Plan    General Health Maintenance  Annual physical completed. No recent tetanus record, last COVID-19 vaccine in 2022, no flu vaccine  last year. Regular exercise, moderate alcohol, occasional hookah use, mixed diet.  - Perform blood work: cholesterol, CBC, comprehensive metabolic panel.  - Obtain tetanus vaccination record.  - Advise flu vaccine and COVID booster in fall.  - Encourage alcohol reduction to no more than two drinks per day.  - Promote increased fruits and vegetables intake.  - Advise cessation of hookah use.  - Encourage regular physical activity and healthy sleep habits.    Chest Pain  Intermittent chest tightness likely due to anxiety and stress. Normal EKG with slightly slow rhythm, no cardiac pathology. Symptoms improve with alcohol, suggesting stress-related component.  - Encouraged reduction of caffeine intake.  - Recommended yoga, meditation, and stress-reduction techniques.  - Provided referral to therapist for anxiety management.  - Consider medication for anxiety if symptoms persist.    Follow-up  Discussed follow-up for health maintenance and symptom management. EKG cost discussed due to self-pay.  - Review blood work results and communicate via MyChart.  - Schedule next annual physical in June 2026.  - Follow up with therapist if anxiety symptoms persist.    Verbal consent obtained to record this visit.

## 2023-09-26 ENCOUNTER — Ambulatory Visit (INDEPENDENT_AMBULATORY_CARE_PROVIDER_SITE_OTHER): Payer: Self-pay | Admitting: Registered Nurse

## 2024-02-21 ENCOUNTER — Other Ambulatory Visit (INDEPENDENT_AMBULATORY_CARE_PROVIDER_SITE_OTHER): Payer: Self-pay
